# Patient Record
Sex: Male | Born: 1995 | Race: White | Hispanic: No | Marital: Single | Smoking: Current some day smoker
Health system: Southern US, Community
[De-identification: ages and names within clinical notes are randomized; demographics above are authoritative.]

---

## 2017-09-21 ENCOUNTER — Emergency Department: Payer: PRIVATE HEALTH INSURANCE

## 2017-09-21 ENCOUNTER — Emergency Department
Admission: EM | Admit: 2017-09-21 | Discharge: 2017-09-21 | Disposition: A | Discharge status: Home / Self Care | Payer: PRIVATE HEALTH INSURANCE | Attending: Emergency Medicine | Admitting: Emergency Medicine

## 2017-09-21 ENCOUNTER — Encounter: Payer: Self-pay | Admitting: Emergency Medicine

## 2017-09-21 DIAGNOSIS — Y999 Unspecified external cause status: Secondary | ICD-10-CM | POA: Diagnosis not present

## 2017-09-21 DIAGNOSIS — R410 Disorientation, unspecified: Secondary | ICD-10-CM | POA: Insufficient documentation

## 2017-09-21 DIAGNOSIS — S060X1A Concussion with loss of consciousness of 30 minutes or less, initial encounter: Secondary | ICD-10-CM | POA: Insufficient documentation

## 2017-09-21 DIAGNOSIS — Y939 Activity, unspecified: Secondary | ICD-10-CM | POA: Diagnosis not present

## 2017-09-21 DIAGNOSIS — X58XXXA Exposure to other specified factors, initial encounter: Secondary | ICD-10-CM | POA: Diagnosis not present

## 2017-09-21 DIAGNOSIS — Y929 Unspecified place or not applicable: Secondary | ICD-10-CM | POA: Diagnosis not present

## 2017-09-21 DIAGNOSIS — F10921 Alcohol use, unspecified with intoxication delirium: Secondary | ICD-10-CM | POA: Diagnosis not present

## 2017-09-21 DIAGNOSIS — R4182 Altered mental status, unspecified: Secondary | ICD-10-CM | POA: Diagnosis present

## 2017-09-21 LAB — COMPREHENSIVE METABOLIC PANEL
ALK PHOS: 87 U/L (ref 38–126)
ALT: 13 U/L — AB (ref 17–63)
ANION GAP: 7 (ref 5–15)
AST: 18 U/L (ref 15–41)
Albumin: 4 g/dL (ref 3.5–5.0)
BILIRUBIN TOTAL: 0.7 mg/dL (ref 0.3–1.2)
BUN: 14 mg/dL (ref 6–20)
CALCIUM: 9.1 mg/dL (ref 8.9–10.3)
CO2: 25 mmol/L (ref 22–32)
Chloride: 108 mmol/L (ref 101–111)
Creatinine, Ser: 0.76 mg/dL (ref 0.61–1.24)
GFR calc non Af Amer: >60 mL/min (ref >60)
GLUCOSE: 95 mg/dL (ref 65–99)
Potassium: 4.2 mmol/L (ref 3.5–5.1)
SODIUM: 140 mmol/L (ref 135–145)
Total Protein: 6.8 g/dL (ref 6.5–8.1)

## 2017-09-21 LAB — CBC WITH DIFFERENTIAL/PLATELET
BASOS PCT: 0 %
Basophils Absolute: 0 10*3/uL (ref 0–0.1)
Eosinophils Absolute: 0.2 10*3/uL (ref 0–0.7)
Eosinophils Relative: 3 %
HEMATOCRIT: 41.6 % (ref 40.0–52.0)
HEMOGLOBIN: 14.1 g/dL (ref 13.0–18.0)
LYMPHS PCT: 24 %
Lymphs Abs: 1.3 10*3/uL (ref 1.0–3.6)
MCH: 31 pg (ref 26.0–34.0)
MCHC: 34 g/dL (ref 32.0–36.0)
MCV: 91.1 fL (ref 80.0–100.0)
MONO ABS: 0.7 10*3/uL (ref 0.2–1.0)
MONOS PCT: 12 %
NEUTROS ABS: 3.4 10*3/uL (ref 1.4–6.5)
NEUTROS PCT: 61 %
Platelets: 214 10*3/uL (ref 150–440)
RBC: 4.56 MIL/uL (ref 4.40–5.90)
RDW: 13.3 % (ref 11.5–14.5)
WBC: 5.7 10*3/uL (ref 3.8–10.6)

## 2017-09-21 LAB — URINE DRUG SCREEN, QUALITATIVE (ARMC ONLY)
AMPHETAMINES, UR SCREEN: NOT DETECTED
BENZODIAZEPINE, UR SCRN: POSITIVE — AB
Barbiturates, Ur Screen: NOT DETECTED
COCAINE METABOLITE, UR ~~LOC~~: NOT DETECTED
Cannabinoid 50 Ng, Ur ~~LOC~~: POSITIVE — AB
MDMA (ECSTASY) UR SCREEN: NOT DETECTED
METHADONE SCREEN, URINE: NOT DETECTED
OPIATE, UR SCREEN: NOT DETECTED
PHENCYCLIDINE (PCP) UR S: NOT DETECTED
Tricyclic, Ur Screen: NOT DETECTED

## 2017-09-21 LAB — URINALYSIS, COMPLETE (UACMP) WITH MICROSCOPIC
BACTERIA UA: NONE SEEN
Bilirubin Urine: NEGATIVE
Glucose, UA: NEGATIVE mg/dL
Hgb urine dipstick: NEGATIVE
Ketones, ur: NEGATIVE mg/dL
Leukocytes, UA: NEGATIVE
Nitrite: NEGATIVE
PH: 6 (ref 5.0–8.0)
Protein, ur: NEGATIVE mg/dL
SPECIFIC GRAVITY, URINE: 1.006 (ref 1.005–1.030)
SQUAMOUS EPITHELIAL / LPF: NONE SEEN
WBC, UA: NONE SEEN WBC/hpf (ref 0–5)

## 2017-09-21 LAB — TROPONIN I: Troponin I: <0.03 ng/mL (ref <0.03)

## 2017-09-21 LAB — ETHANOL: Alcohol, Ethyl (B): <10 mg/dL (ref <10)

## 2017-09-21 MED ORDER — SODIUM CHLORIDE 0.9 % IV BOLUS (SEPSIS)
1000.0000 mL | Freq: Once | INTRAVENOUS | Status: AC
  Administered 2017-09-21: 1000 mL via INTRAVENOUS

## 2017-09-21 NOTE — ED Notes (Signed)
RN in room, pt not responsive to voice. Pt responsive to painful stimuli. Dr. Darnelle CatalanMalinda informed. CT called to come get patient for head CT
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
## 2017-09-21 NOTE — ED Notes (Signed)
Dr. Roxan Hockeyobinson notified of patient presentation. See orders. C collar put in place at this time by this RN. Brother and primary RN at bedside.

## 2017-09-21 NOTE — ED Notes (Signed)
Pt brought to ED by his brother. Pt reports that he was drinking last night and that he took xanax. Pt does not remember much of what happened but states that he was "in and out". Pt states that he does not remember falling although he has abrasion to the right forehead. Pt is slurring his words.

## 2017-09-21 NOTE — ED Notes (Signed)
Pt ambulatory to the restroom with RN in room for supervision. Pt more steady on feet, words are not slurred. Pt A & O to his baseline. Pt mother at bedside
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
## 2017-09-21 NOTE — ED Notes (Signed)

## 2017-09-21 NOTE — ED Notes (Signed)
ED Provider at bedside. 
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
